# Patient Record
Sex: Male | Born: 1960 | Race: White | Hispanic: No | Marital: Single | Smoking: Never smoker
Health system: Southern US, Community
[De-identification: ages and names within clinical notes are randomized; demographics above are authoritative.]

## PROBLEM LIST (undated history)

## (undated) DIAGNOSIS — M1990 Unspecified osteoarthritis, unspecified site: Secondary | ICD-10-CM

## (undated) HISTORY — PX: APPENDECTOMY: SHX54

## (undated) HISTORY — PX: JOINT REPLACEMENT: SHX530

## (undated) HISTORY — PX: KNEE ARTHROSCOPY: SHX127

## (undated) HISTORY — PX: LIPOMA EXCISION: SHX5283

---

## 2008-03-27 ENCOUNTER — Ambulatory Visit: Payer: Self-pay | Admitting: Otolaryngology

## 2008-04-04 ENCOUNTER — Ambulatory Visit: Payer: Self-pay | Admitting: Otolaryngology

## 2012-09-18 ENCOUNTER — Ambulatory Visit: Payer: Self-pay | Admitting: Orthopedic Surgery

## 2017-07-10 DIAGNOSIS — M1712 Unilateral primary osteoarthritis, left knee: Secondary | ICD-10-CM | POA: Insufficient documentation

## 2017-07-20 ENCOUNTER — Encounter
Admission: RE | Admit: 2017-07-20 | Discharge: 2017-07-20 | Disposition: A | Discharge status: Home / Self Care | Payer: BLUE CROSS/BLUE SHIELD | Source: Ambulatory Visit | Attending: Orthopedic Surgery | Admitting: Orthopedic Surgery

## 2017-07-20 ENCOUNTER — Other Ambulatory Visit: Payer: Self-pay

## 2017-07-20 DIAGNOSIS — Z01818 Encounter for other preprocedural examination: Secondary | ICD-10-CM | POA: Diagnosis present

## 2017-07-20 DIAGNOSIS — M1712 Unilateral primary osteoarthritis, left knee: Secondary | ICD-10-CM | POA: Diagnosis not present

## 2017-07-20 HISTORY — DX: Unspecified osteoarthritis, unspecified site: M19.90

## 2017-07-20 LAB — CBC
HCT: 44.2 % (ref 40.0–52.0)
Hemoglobin: 14.7 g/dL (ref 13.0–18.0)
MCH: 31.9 pg (ref 26.0–34.0)
MCHC: 33.3 g/dL (ref 32.0–36.0)
MCV: 95.5 fL (ref 80.0–100.0)
PLATELETS: 253 10*3/uL (ref 150–440)
RBC: 4.62 MIL/uL (ref 4.40–5.90)
RDW: 13.8 % (ref 11.5–14.5)
WBC: 6 10*3/uL (ref 3.8–10.6)

## 2017-07-20 LAB — TYPE AND SCREEN
ABO/RH(D): A POS
Antibody Screen: NEGATIVE

## 2017-07-20 LAB — COMPREHENSIVE METABOLIC PANEL
ALK PHOS: 48 U/L (ref 38–126)
ALT: 19 U/L (ref 17–63)
ANION GAP: 7 (ref 5–15)
AST: 21 U/L (ref 15–41)
Albumin: 4 g/dL (ref 3.5–5.0)
BUN: 18 mg/dL (ref 6–20)
CHLORIDE: 106 mmol/L (ref 101–111)
CO2: 29 mmol/L (ref 22–32)
Calcium: 9.2 mg/dL (ref 8.9–10.3)
Creatinine, Ser: 0.88 mg/dL (ref 0.61–1.24)
Glucose, Bld: 90 mg/dL (ref 65–99)
Potassium: 3.7 mmol/L (ref 3.5–5.1)
SODIUM: 142 mmol/L (ref 135–145)
Total Bilirubin: 0.8 mg/dL (ref 0.3–1.2)
Total Protein: 7.2 g/dL (ref 6.5–8.1)

## 2017-07-20 LAB — PROTIME-INR
INR: 0.99
PROTHROMBIN TIME: 13 s (ref 11.4–15.2)

## 2017-07-20 LAB — URINALYSIS, ROUTINE W REFLEX MICROSCOPIC
Bilirubin Urine: NEGATIVE
GLUCOSE, UA: NEGATIVE mg/dL
HGB URINE DIPSTICK: NEGATIVE
Ketones, ur: NEGATIVE mg/dL
LEUKOCYTES UA: NEGATIVE
Nitrite: NEGATIVE
PH: 5 (ref 5.0–8.0)
PROTEIN: NEGATIVE mg/dL
SPECIFIC GRAVITY, URINE: 1.016 (ref 1.005–1.030)

## 2017-07-20 LAB — SURGICAL PCR SCREEN
MRSA, PCR: NEGATIVE
STAPHYLOCOCCUS AUREUS: NEGATIVE

## 2017-07-20 LAB — APTT: aPTT: 28 seconds (ref 24–36)

## 2017-07-20 LAB — C-REACTIVE PROTEIN: CRP: 1.2 mg/dL — AB (ref <1.0)

## 2017-07-20 LAB — SEDIMENTATION RATE: Sed Rate: 6 mm/hr (ref 0–20)

## 2017-07-20 NOTE — Patient Instructions (Signed)
Your procedure is scheduled on: 08/05/17 Fri Report to Same Day Surgery 2nd floor medical mall [facility](Medical Mall Entrance-take elevator on left to 2nd floor.  Check in with surgery information desk.) To find out your arrival time please call [phone] between 1PM - 3PM on 08/04/17 Thurs  Remember: Instructions that are not followed completely may result in serious medical risk, up to and including death, or upon the discretion of your surgeon and anesthesiologist your surgery may need to be rescheduled.    _x___ 1. Do not eat food after midnight the night before your procedure. You may drink clear liquids up to 2 hours before you are scheduled to arrive at the hospital for your procedure.  Do not drink clear liquids within 2 hours of your scheduled arrival to the hospital.  Clear liquids include  --Water or Apple juice without pulp  --Clear carbohydrate beverage such as ClearFast or Gatorade  --Black Coffee or Clear Tea (No milk, no creamers, do not add anything to                  the coffee or Tea Type 1 and type 2 diabetics should only drink water.  No gum chewing or hard candies.     __x__ 2. No Alcohol for 24 hours before or after surgery.   __x__3. No Smoking or e-cigarettes for 24 prior to surgery.  Do not use any chewable tobacco products for at least 6 hour prior to surgery   ____  4. Bring all medications with you on the day of surgery if instructed.    __x__ 5. Notify your doctor if there is any change in your medical condition     (cold, fever, infections).    x___6. On the morning of surgery brush your teeth with toothpaste and water.  You may rinse your mouth with mouth wash if you wish.  Do not swallow any toothpaste or mouthwash.   Do not wear jewelry, make-up, hairpins, clips or nail polish.  Do not wear lotions, powders, or perfumes. You may wear deodorant.  Do not shave 48 hours prior to surgery. Men may shave face and neck.  Do not bring valuables to the hospital.     [facility] is not responsible for any belongings or valuables.               Contacts, dentures or bridgework may not be worn into surgery.  Leave your suitcase in the car. After surgery it may be brought to your room.  For patients admitted to the hospital, discharge time is determined by your                       treatment team.  _  Patients discharged the day of surgery will not be allowed to drive home.  You will need someone to drive you home and stay with you the night of your procedure.    Please read over the following fact sheets that you were given:   [facility] Preparing for Surgery and or MRSA Information   _x___ Take anti-hypertensive listed below, cardiac, seizure, asthma,     anti-reflux and psychiatric medicines. These include:  1. None  2.  3.  4.  5.  6.  ____Fleets enema or Magnesium Citrate as directed.   _x___ Use CHG Soap or sage wipes as directed on instruction sheet   ____ Use inhalers on the day of surgery and bring to hospital day of surgery  ____ Stop Metformin and Janumet 2 days prior to surgery.    ____ Take 1/2 of usual insulin dose the night before surgery and none on the morning     surgery.   _x___ Follow recommendations from Cardiologist, Pulmonologist or PCP regarding          stopping Aspirin, Coumadin, Plavix ,Eliquis, Effient, or Pradaxa, and Pletal.  X____Stop Anti-inflammatories such as Advil, Aleve, Ibuprofen, Motrin, Naproxen, Naprosyn, Goodies powders or aspirin products. OK to take Tylenol and   Celebrex.  Stop Meloxicam 1 week before surgery.   _x___ Stop supplements until after surgery.  But may continue Vitamin D, Vitamin B,       and multivitamin.   ____ Bring C-Pap to the hospital.

## 2017-07-22 LAB — URINE CULTURE
CULTURE: NO GROWTH
Special Requests: NORMAL

## 2017-07-22 NOTE — Pre-Procedure Instructions (Signed)
CRP results sent to Dr. Hooten for review. 

## 2017-08-04 MED ORDER — CEFAZOLIN SODIUM-DEXTROSE 2-4 GM/100ML-% IV SOLN: 2.0000 g | INTRAVENOUS | Status: DC

## 2017-08-04 MED ORDER — TRANEXAMIC ACID 1000 MG/10ML IV SOLN
1000.0000 mg | INTRAVENOUS | Status: DC
  Filled 2017-08-04: qty 10

## 2017-08-05 ENCOUNTER — Other Ambulatory Visit: Payer: Self-pay

## 2017-08-05 ENCOUNTER — Inpatient Hospital Stay
Admission: RE | Admit: 2017-08-05 | Discharge: 2017-08-07 | DRG: 470 | Disposition: A | Discharge status: Home Health | Payer: BLUE CROSS/BLUE SHIELD | Source: Ambulatory Visit | Attending: Orthopedic Surgery | Admitting: Orthopedic Surgery

## 2017-08-05 ENCOUNTER — Inpatient Hospital Stay: Payer: BLUE CROSS/BLUE SHIELD

## 2017-08-05 ENCOUNTER — Ambulatory Visit: Payer: BLUE CROSS/BLUE SHIELD | Admitting: Anesthesiology

## 2017-08-05 ENCOUNTER — Encounter
Admission: RE | Disposition: A | Discharge status: Home Health | Payer: Self-pay | Source: Ambulatory Visit | Attending: Orthopedic Surgery

## 2017-08-05 ENCOUNTER — Encounter: Payer: Self-pay | Admitting: Orthopedic Surgery

## 2017-08-05 DIAGNOSIS — Z96659 Presence of unspecified artificial knee joint: Secondary | ICD-10-CM

## 2017-08-05 DIAGNOSIS — M1712 Unilateral primary osteoarthritis, left knee: Principal | ICD-10-CM | POA: Diagnosis present

## 2017-08-05 HISTORY — PX: KNEE ARTHROPLASTY: SHX992

## 2017-08-05 LAB — TYPE AND SCREEN
ABO/RH(D): A POS
Antibody Screen: NEGATIVE

## 2017-08-05 SURGERY — ARTHROPLASTY, KNEE, TOTAL, USING IMAGELESS COMPUTER-ASSISTED NAVIGATION
Anesthesia: Spinal | Site: Knee | Laterality: Left | Wound class: Clean

## 2017-08-05 MED ORDER — CEFAZOLIN SODIUM-DEXTROSE 2-4 GM/100ML-% IV SOLN
2.0000 g | Freq: Four times a day (QID) | INTRAVENOUS | Status: AC
  Administered 2017-08-05 – 2017-08-06 (×4): 2 g via INTRAVENOUS
  Filled 2017-08-05 (×4): qty 100

## 2017-08-05 MED ORDER — PROPOFOL 500 MG/50ML IV EMUL
INTRAVENOUS | Status: AC
  Filled 2017-08-05: qty 100

## 2017-08-05 MED ORDER — BUPIVACAINE LIPOSOME 1.3 % IJ SUSP
INTRAMUSCULAR | Status: AC
  Filled 2017-08-05: qty 20

## 2017-08-05 MED ORDER — SODIUM CHLORIDE 0.9 % IV SOLN
INTRAVENOUS | Status: DC
  Administered 2017-08-05: 16:00:00 via INTRAVENOUS
  Administered 2017-08-06: 100 mL/h via INTRAVENOUS

## 2017-08-05 MED ORDER — LACTATED RINGERS IV SOLN
INTRAVENOUS | Status: DC
  Administered 2017-08-05: 11:00:00 via INTRAVENOUS

## 2017-08-05 MED ORDER — BISACODYL 10 MG RE SUPP: 10.0000 mg | Freq: Every day | RECTAL | Status: DC | PRN

## 2017-08-05 MED ORDER — PHENOL 1.4 % MT LIQD
1.0000 | OROMUCOSAL | Status: DC | PRN
  Filled 2017-08-05: qty 177

## 2017-08-05 MED ORDER — TRANEXAMIC ACID 1000 MG/10ML IV SOLN
1000.0000 mg | Freq: Once | INTRAVENOUS | Status: AC
  Administered 2017-08-05: 1000 mg via INTRAVENOUS
  Filled 2017-08-05: qty 10

## 2017-08-05 MED ORDER — MENTHOL 3 MG MT LOZG
1.0000 | LOZENGE | OROMUCOSAL | Status: DC | PRN
  Filled 2017-08-05: qty 9

## 2017-08-05 MED ORDER — ONDANSETRON HCL 4 MG/2ML IJ SOLN: 4.0000 mg | Freq: Four times a day (QID) | INTRAMUSCULAR | Status: DC | PRN

## 2017-08-05 MED ORDER — FAMOTIDINE 20 MG PO TABS
20.0000 mg | ORAL_TABLET | Freq: Once | ORAL | Status: AC
  Administered 2017-08-05: 20 mg via ORAL

## 2017-08-05 MED ORDER — TETRACAINE HCL 1 % IJ SOLN
INTRAMUSCULAR | Status: DC | PRN
  Administered 2017-08-05: 2 mg via INTRASPINAL

## 2017-08-05 MED ORDER — BUPIVACAINE HCL (PF) 0.5 % IJ SOLN
INTRAMUSCULAR | Status: DC | PRN
  Administered 2017-08-05: 3 mL

## 2017-08-05 MED ORDER — ACETAMINOPHEN 10 MG/ML IV SOLN
1000.0000 mg | Freq: Four times a day (QID) | INTRAVENOUS | Status: AC
  Administered 2017-08-05 – 2017-08-06 (×4): 1000 mg via INTRAVENOUS
  Filled 2017-08-05 (×4): qty 100

## 2017-08-05 MED ORDER — MIDAZOLAM HCL 2 MG/2ML IJ SOLN
INTRAMUSCULAR | Status: AC
  Filled 2017-08-05: qty 2

## 2017-08-05 MED ORDER — ADULT MULTIVITAMIN W/MINERALS CH: 1.0000 | ORAL_TABLET | Freq: Every day | ORAL | Status: DC | PRN

## 2017-08-05 MED ORDER — DIPHENHYDRAMINE HCL 12.5 MG/5ML PO ELIX
12.5000 mg | ORAL_SOLUTION | ORAL | Status: DC | PRN
  Administered 2017-08-05: 25 mg via ORAL
  Filled 2017-08-05: qty 10

## 2017-08-05 MED ORDER — EPINEPHRINE PF 1 MG/ML IJ SOLN
INTRAMUSCULAR | Status: AC
  Filled 2017-08-05: qty 1

## 2017-08-05 MED ORDER — MIDAZOLAM HCL 5 MG/5ML IJ SOLN
INTRAMUSCULAR | Status: DC | PRN
  Administered 2017-08-05: 2 mg via INTRAVENOUS

## 2017-08-05 MED ORDER — HYDROMORPHONE HCL 1 MG/ML IJ SOLN: 0.5000 mg | INTRAMUSCULAR | Status: DC | PRN

## 2017-08-05 MED ORDER — DEXAMETHASONE SODIUM PHOSPHATE 10 MG/ML IJ SOLN
8.0000 mg | Freq: Once | INTRAMUSCULAR | Status: AC
  Administered 2017-08-05: 8 mg via INTRAVENOUS

## 2017-08-05 MED ORDER — FLEET ENEMA 7-19 GM/118ML RE ENEM: 1.0000 | ENEMA | Freq: Once | RECTAL | Status: DC | PRN

## 2017-08-05 MED ORDER — PROPOFOL 500 MG/50ML IV EMUL
INTRAVENOUS | Status: DC | PRN
  Administered 2017-08-05: 50 ug/kg/min via INTRAVENOUS

## 2017-08-05 MED ORDER — BUPIVACAINE HCL (PF) 0.25 % IJ SOLN
INTRAMUSCULAR | Status: AC
  Filled 2017-08-05: qty 60

## 2017-08-05 MED ORDER — GABAPENTIN 300 MG PO CAPS
300.0000 mg | ORAL_CAPSULE | Freq: Every day | ORAL | Status: DC
  Administered 2017-08-05 – 2017-08-06 (×2): 300 mg via ORAL
  Filled 2017-08-05 (×2): qty 1

## 2017-08-05 MED ORDER — CELECOXIB 200 MG PO CAPS
400.0000 mg | ORAL_CAPSULE | Freq: Once | ORAL | Status: AC
  Administered 2017-08-05: 400 mg via ORAL

## 2017-08-05 MED ORDER — TRAMADOL HCL 50 MG PO TABS
50.0000 mg | ORAL_TABLET | ORAL | Status: DC | PRN
  Administered 2017-08-05 – 2017-08-06 (×3): 100 mg via ORAL
  Filled 2017-08-05 (×3): qty 2

## 2017-08-05 MED ORDER — NEOMYCIN-POLYMYXIN B GU 40-200000 IR SOLN
Status: AC
Start: 2017-08-05 — End: 2017-08-05
  Filled 2017-08-05: qty 20

## 2017-08-05 MED ORDER — BUPIVACAINE HCL (PF) 0.25 % IJ SOLN
INTRAMUSCULAR | Status: DC | PRN
  Administered 2017-08-05: 60 mL

## 2017-08-05 MED ORDER — BUPIVACAINE HCL (PF) 0.5 % IJ SOLN
INTRAMUSCULAR | Status: AC
  Filled 2017-08-05: qty 10

## 2017-08-05 MED ORDER — SODIUM CHLORIDE 0.9 % IJ SOLN
INTRAMUSCULAR | Status: AC
  Filled 2017-08-05: qty 50

## 2017-08-05 MED ORDER — FERROUS SULFATE 325 (65 FE) MG PO TABS
325.0000 mg | ORAL_TABLET | Freq: Two times a day (BID) | ORAL | Status: DC
  Administered 2017-08-06 – 2017-08-07 (×3): 325 mg via ORAL
  Filled 2017-08-05 (×3): qty 1

## 2017-08-05 MED ORDER — FENTANYL CITRATE (PF) 100 MCG/2ML IJ SOLN: 25.0000 ug | INTRAMUSCULAR | Status: DC | PRN

## 2017-08-05 MED ORDER — DEXAMETHASONE SODIUM PHOSPHATE 10 MG/ML IJ SOLN
INTRAMUSCULAR | Status: AC
  Administered 2017-08-05: 8 mg via INTRAVENOUS
  Filled 2017-08-05: qty 1

## 2017-08-05 MED ORDER — CHLORHEXIDINE GLUCONATE 4 % EX LIQD: 60.0000 mL | Freq: Once | CUTANEOUS | Status: DC

## 2017-08-05 MED ORDER — SODIUM CHLORIDE 0.9 % IV SOLN
INTRAVENOUS | Status: DC | PRN
  Administered 2017-08-05: 60 mL

## 2017-08-05 MED ORDER — ONDANSETRON HCL 4 MG/2ML IJ SOLN
INTRAMUSCULAR | Status: DC | PRN
  Administered 2017-08-05: 4 mg via INTRAVENOUS

## 2017-08-05 MED ORDER — ALUM & MAG HYDROXIDE-SIMETH 200-200-20 MG/5ML PO SUSP: 30.0000 mL | ORAL | Status: DC | PRN

## 2017-08-05 MED ORDER — CEFAZOLIN SODIUM-DEXTROSE 2-4 GM/100ML-% IV SOLN
INTRAVENOUS | Status: AC
  Filled 2017-08-05: qty 100

## 2017-08-05 MED ORDER — GABAPENTIN 300 MG PO CAPS
ORAL_CAPSULE | ORAL | Status: AC
  Administered 2017-08-05: 300 mg via ORAL
  Filled 2017-08-05: qty 1

## 2017-08-05 MED ORDER — DEXAMETHASONE SODIUM PHOSPHATE 10 MG/ML IJ SOLN
INTRAMUSCULAR | Status: AC
  Filled 2017-08-05: qty 1

## 2017-08-05 MED ORDER — ONDANSETRON HCL 4 MG/2ML IJ SOLN
INTRAMUSCULAR | Status: AC
  Filled 2017-08-05: qty 2

## 2017-08-05 MED ORDER — CELECOXIB 200 MG PO CAPS
ORAL_CAPSULE | ORAL | Status: AC
  Administered 2017-08-05: 400 mg via ORAL
  Filled 2017-08-05: qty 2

## 2017-08-05 MED ORDER — ENOXAPARIN SODIUM 30 MG/0.3ML ~~LOC~~ SOLN
30.0000 mg | Freq: Two times a day (BID) | SUBCUTANEOUS | Status: DC
  Administered 2017-08-06 – 2017-08-07 (×3): 30 mg via SUBCUTANEOUS
  Filled 2017-08-05 (×3): qty 0.3

## 2017-08-05 MED ORDER — ACETAMINOPHEN 10 MG/ML IV SOLN
INTRAVENOUS | Status: AC
  Filled 2017-08-05: qty 100

## 2017-08-05 MED ORDER — ACETAMINOPHEN 10 MG/ML IV SOLN
INTRAVENOUS | Status: DC | PRN
  Administered 2017-08-05: 1000 mg via INTRAVENOUS

## 2017-08-05 MED ORDER — DEXAMETHASONE SODIUM PHOSPHATE 10 MG/ML IJ SOLN
INTRAMUSCULAR | Status: DC | PRN
  Administered 2017-08-05: 10 mg via INTRAVENOUS

## 2017-08-05 MED ORDER — VITAMIN C 500 MG PO TABS
500.0000 mg | ORAL_TABLET | Freq: Every day | ORAL | Status: DC | PRN
  Filled 2017-08-05: qty 1

## 2017-08-05 MED ORDER — CEFAZOLIN SODIUM-DEXTROSE 2-3 GM-%(50ML) IV SOLR
INTRAVENOUS | Status: DC | PRN
  Administered 2017-08-05: 2 g via INTRAVENOUS

## 2017-08-05 MED ORDER — PROPOFOL 10 MG/ML IV BOLUS
INTRAVENOUS | Status: DC | PRN
  Administered 2017-08-05 (×7): 24 mg via INTRAVENOUS
  Administered 2017-08-05: 50 mg via INTRAVENOUS

## 2017-08-05 MED ORDER — TRANEXAMIC ACID 1000 MG/10ML IV SOLN
INTRAVENOUS | Status: DC | PRN
  Administered 2017-08-05: 1000 mg via INTRAVENOUS

## 2017-08-05 MED ORDER — PROPOFOL 500 MG/50ML IV EMUL
INTRAVENOUS | Status: AC
  Filled 2017-08-05: qty 50

## 2017-08-05 MED ORDER — SENNOSIDES-DOCUSATE SODIUM 8.6-50 MG PO TABS
1.0000 | ORAL_TABLET | Freq: Two times a day (BID) | ORAL | Status: DC
  Administered 2017-08-05 – 2017-08-06 (×3): 1 via ORAL
  Filled 2017-08-05 (×3): qty 1

## 2017-08-05 MED ORDER — ONDANSETRON HCL 4 MG/2ML IJ SOLN: 4.0000 mg | Freq: Once | INTRAMUSCULAR | Status: DC | PRN

## 2017-08-05 MED ORDER — METOCLOPRAMIDE HCL 10 MG PO TABS
10.0000 mg | ORAL_TABLET | Freq: Three times a day (TID) | ORAL | Status: DC
  Administered 2017-08-05 – 2017-08-07 (×6): 10 mg via ORAL
  Filled 2017-08-05 (×6): qty 1

## 2017-08-05 MED ORDER — METOCLOPRAMIDE HCL 10 MG PO TABS: 5.0000 mg | ORAL_TABLET | Freq: Three times a day (TID) | ORAL | Status: DC | PRN

## 2017-08-05 MED ORDER — LIDOCAINE HCL (CARDIAC) 20 MG/ML IV SOLN
INTRAVENOUS | Status: DC | PRN
  Administered 2017-08-05: 60 mg via INTRAVENOUS

## 2017-08-05 MED ORDER — ACETAMINOPHEN 325 MG PO TABS: 325.0000 mg | ORAL_TABLET | Freq: Four times a day (QID) | ORAL | Status: DC | PRN

## 2017-08-05 MED ORDER — GABAPENTIN 300 MG PO CAPS
300.0000 mg | ORAL_CAPSULE | Freq: Once | ORAL | Status: AC
  Administered 2017-08-05: 300 mg via ORAL

## 2017-08-05 MED ORDER — PANTOPRAZOLE SODIUM 40 MG PO TBEC
40.0000 mg | DELAYED_RELEASE_TABLET | Freq: Two times a day (BID) | ORAL | Status: DC
  Administered 2017-08-05 – 2017-08-07 (×4): 40 mg via ORAL
  Filled 2017-08-05 (×4): qty 1

## 2017-08-05 MED ORDER — FAMOTIDINE 20 MG PO TABS
ORAL_TABLET | ORAL | Status: AC
  Administered 2017-08-05: 20 mg via ORAL
  Filled 2017-08-05: qty 1

## 2017-08-05 MED ORDER — NEOMYCIN-POLYMYXIN B GU 40-200000 IR SOLN
Status: DC | PRN
  Administered 2017-08-05: 14 mL

## 2017-08-05 MED ORDER — MAGNESIUM HYDROXIDE 400 MG/5ML PO SUSP
30.0000 mL | Freq: Every day | ORAL | Status: DC | PRN
  Administered 2017-08-07: 30 mL via ORAL
  Filled 2017-08-05: qty 30

## 2017-08-05 MED ORDER — CELECOXIB 200 MG PO CAPS
200.0000 mg | ORAL_CAPSULE | Freq: Two times a day (BID) | ORAL | Status: DC
  Administered 2017-08-05 – 2017-08-07 (×4): 200 mg via ORAL
  Filled 2017-08-05 (×4): qty 1

## 2017-08-05 MED ORDER — OXYCODONE HCL 5 MG PO TABS
10.0000 mg | ORAL_TABLET | ORAL | Status: DC | PRN
  Administered 2017-08-05 – 2017-08-06 (×2): 10 mg via ORAL
  Filled 2017-08-05 (×2): qty 2

## 2017-08-05 MED ORDER — LIDOCAINE HCL (PF) 2 % IJ SOLN
INTRAMUSCULAR | Status: AC
  Filled 2017-08-05: qty 10

## 2017-08-05 MED ORDER — OXYCODONE HCL 5 MG PO TABS
5.0000 mg | ORAL_TABLET | ORAL | Status: DC | PRN
  Administered 2017-08-05 – 2017-08-07 (×3): 5 mg via ORAL
  Filled 2017-08-05 (×3): qty 1

## 2017-08-05 MED ORDER — ONDANSETRON HCL 4 MG PO TABS: 4.0000 mg | ORAL_TABLET | Freq: Four times a day (QID) | ORAL | Status: DC | PRN

## 2017-08-05 MED ORDER — METOCLOPRAMIDE HCL 5 MG/ML IJ SOLN: 5.0000 mg | Freq: Three times a day (TID) | INTRAMUSCULAR | Status: DC | PRN

## 2017-08-05 SURGICAL SUPPLY — 67 items
BATTERY INSTRU NAVIGATION (MISCELLANEOUS) ×12 IMPLANT
BLADE CLIPPER SURG (BLADE) ×3 IMPLANT
BLADE SAGITTAL 25.0X1.19X90 (BLADE) ×2 IMPLANT
BLADE SAGITTAL 25.0X1.19X90MM (BLADE) ×1
BLADE SAW 1/2 (BLADE) ×3 IMPLANT
BLADE SAW 70X12.5 (BLADE) IMPLANT
CANISTER SUCT 1200ML W/VALVE (MISCELLANEOUS) ×3 IMPLANT
CANISTER SUCT 3000ML PPV (MISCELLANEOUS) ×6 IMPLANT
CAPT KNEE TOTAL 3 ATTUNE ×3 IMPLANT
CEMENT HV SMART SET (Cement) ×6 IMPLANT
COOLER POLAR GLACIER W/PUMP (MISCELLANEOUS) ×3 IMPLANT
CUFF TOURN 24 STER (MISCELLANEOUS) IMPLANT
CUFF TOURN 30 STER DUAL PORT (MISCELLANEOUS) ×3 IMPLANT
DRAPE SHEET LG 3/4 BI-LAMINATE (DRAPES) ×3 IMPLANT
DRSG DERMACEA 8X12 NADH (GAUZE/BANDAGES/DRESSINGS) ×3 IMPLANT
DRSG OPSITE POSTOP 4X14 (GAUZE/BANDAGES/DRESSINGS) ×3 IMPLANT
DRSG TEGADERM 4X4.75 (GAUZE/BANDAGES/DRESSINGS) ×3 IMPLANT
DURAPREP 26ML APPLICATOR (WOUND CARE) ×6 IMPLANT
ELECT CAUTERY BLADE 6.4 (BLADE) ×3 IMPLANT
ELECT REM PT RETURN 9FT ADLT (ELECTROSURGICAL) ×3
ELECTRODE REM PT RTRN 9FT ADLT (ELECTROSURGICAL) ×1 IMPLANT
EVACUATOR 1/8 PVC DRAIN (DRAIN) ×3 IMPLANT
EX-PIN ORTHOLOCK NAV 4X150 (PIN) ×6 IMPLANT
GLOVE BIOGEL M STRL SZ7.5 (GLOVE) ×6 IMPLANT
GLOVE BIOGEL PI IND STRL 7.0 (GLOVE) ×6 IMPLANT
GLOVE BIOGEL PI IND STRL 9 (GLOVE) ×1 IMPLANT
GLOVE BIOGEL PI INDICATOR 7.0 (GLOVE) ×12
GLOVE BIOGEL PI INDICATOR 9 (GLOVE) ×2
GLOVE INDICATOR 8.0 STRL GRN (GLOVE) ×3 IMPLANT
GLOVE SURG SYN 9.0  PF PI (GLOVE) ×2
GLOVE SURG SYN 9.0 PF PI (GLOVE) ×1 IMPLANT
GOWN STRL REUS W/ TWL LRG LVL3 (GOWN DISPOSABLE) ×2 IMPLANT
GOWN STRL REUS W/TWL 2XL LVL3 (GOWN DISPOSABLE) ×3 IMPLANT
GOWN STRL REUS W/TWL LRG LVL3 (GOWN DISPOSABLE) ×4
HOLDER FOLEY CATH W/STRAP (MISCELLANEOUS) ×3 IMPLANT
HOOD PEEL AWAY FLYTE STAYCOOL (MISCELLANEOUS) ×6 IMPLANT
KIT TURNOVER KIT A (KITS) ×3 IMPLANT
KNIFE SCULPS 14X20 (INSTRUMENTS) ×3 IMPLANT
LABEL OR SOLS (LABEL) ×3 IMPLANT
NDL SAFETY ECLIPSE 18X1.5 (NEEDLE) ×1 IMPLANT
NEEDLE HYPO 18GX1.5 SHARP (NEEDLE) ×2
NEEDLE SPNL 20GX3.5 QUINCKE YW (NEEDLE) ×6 IMPLANT
NS IRRIG 500ML POUR BTL (IV SOLUTION) ×3 IMPLANT
PACK TOTAL KNEE (MISCELLANEOUS) ×3 IMPLANT
PAD WRAPON POLAR KNEE (MISCELLANEOUS) ×1 IMPLANT
PIN DRILL QUICK PACK ×3 IMPLANT
PIN FIXATION 1/8DIA X 3INL (PIN) ×3 IMPLANT
PULSAVAC PLUS IRRIG FAN TIP (DISPOSABLE) ×3
SOL .9 NS 3000ML IRR  AL (IV SOLUTION) ×2
SOL .9 NS 3000ML IRR UROMATIC (IV SOLUTION) ×1 IMPLANT
SOL PREP PVP 2OZ (MISCELLANEOUS) ×3
SOLUTION PREP PVP 2OZ (MISCELLANEOUS) ×1 IMPLANT
SPONGE DRAIN TRACH 4X4 STRL 2S (GAUZE/BANDAGES/DRESSINGS) ×3 IMPLANT
STAPLER SKIN PROX 35W (STAPLE) ×3 IMPLANT
STRAP TIBIA SHORT (MISCELLANEOUS) ×3 IMPLANT
SUCTION FRAZIER HANDLE 10FR (MISCELLANEOUS) ×2
SUCTION TUBE FRAZIER 10FR DISP (MISCELLANEOUS) ×1 IMPLANT
SUT VIC AB 0 CT1 36 (SUTURE) ×3 IMPLANT
SUT VIC AB 1 CT1 36 (SUTURE) ×6 IMPLANT
SUT VIC AB 2-0 CT2 27 (SUTURE) ×3 IMPLANT
SYR 20CC LL (SYRINGE) ×3 IMPLANT
SYR 30ML LL (SYRINGE) ×6 IMPLANT
TIP FAN IRRIG PULSAVAC PLUS (DISPOSABLE) ×1 IMPLANT
TOWEL OR 17X26 4PK STRL BLUE (TOWEL DISPOSABLE) ×3 IMPLANT
TOWER CARTRIDGE SMART MIX (DISPOSABLE) ×3 IMPLANT
TRAY FOLEY W/METER SILVER 16FR (SET/KITS/TRAYS/PACK) ×3 IMPLANT
WRAPON POLAR PAD KNEE (MISCELLANEOUS) ×3

## 2017-08-05 NOTE — Transfer of Care (Signed)
Immediate Anesthesia Transfer of Care Note  Patient: Edward Compton  Procedure(s) Performed: COMPUTER ASSISTED TOTAL KNEE ARTHROPLASTY (Left Knee)  Patient Location: PACU  Anesthesia Type:Spinal  Level of Consciousness: awake, alert , oriented and patient cooperative  Airway & Oxygen Therapy: Patient Spontanous Breathing  Post-op Assessment: Report given to RN and Post -op Vital signs reviewed and stable  Post vital signs: Reviewed and stable  Last Vitals:  Vitals Value Taken Time  BP 118/75 08/05/2017  2:58 PM  Temp 36.6 C 08/05/2017  2:58 PM  Pulse 82 08/05/2017  3:00 PM  Resp 14 08/05/2017  3:01 PM  SpO2 96 % 08/05/2017  3:00 PM  Vitals shown include unvalidated device data.  Last Pain:  Vitals:   08/05/17 0955  TempSrc: Tympanic  PainSc: 0-No pain         Complications: No apparent anesthesia complications

## 2017-08-05 NOTE — Anesthesia Preprocedure Evaluation (Addendum)
Anesthesia Evaluation  Patient identified by MRN, date of birth, ID band Patient awake    Reviewed: Allergy & Precautions, NPO status , Patient's Chart, lab work & pertinent test results  Airway Mallampati: II  TM Distance: >3 FB     Dental   Pulmonary neg pulmonary ROS,    Pulmonary exam normal        Cardiovascular negative cardio ROS Normal cardiovascular exam     Neuro/Psych negative neurological ROS  negative psych ROS   GI/Hepatic negative GI ROS, Neg liver ROS,   Endo/Other  negative endocrine ROS  Renal/GU negative Renal ROS  negative genitourinary   Musculoskeletal   Abdominal Normal abdominal exam  (+)   Peds negative pediatric ROS (+)  Hematology negative hematology ROS (+)   Anesthesia Other Findings   Reproductive/Obstetrics                             Anesthesia Physical Anesthesia Plan  ASA: II  Anesthesia Plan: Spinal   Post-op Pain Management:    Induction: Intravenous  PONV Risk Score and Plan:   Airway Management Planned: Nasal Cannula  Additional Equipment:   Intra-op Plan:   Post-operative Plan:   Informed Consent: I have reviewed the patients History and Physical, chart, labs and discussed the procedure including the risks, benefits and alternatives for the proposed anesthesia with the patient or authorized representative who has indicated his/her understanding and acceptance.   Dental advisory given  Plan Discussed with: CRNA  Anesthesia Plan Comments:         Anesthesia Quick Evaluation

## 2017-08-05 NOTE — NC FL2 (Signed)
[place] MEDICAID FL2 LEVEL OF CARE SCREENING TOOL     IDENTIFICATION  Patient Name: Edward Compton Birthdate: 08/25/1960 Sex: male Admission Date (Current Location): 08/05/2017  [place]   Facility and Address:  [facility], [address], [place], [place] [address]      Provider Number: [id number]  Attending Physician Name and Address:  Donato HeinzHooten, James P, MD  Relative Name and Phone Number:       Current Level of Care: Hospital Recommended Level of Care: Skilled Nursing Facility Prior Approval Number:    Date Approved/Denied:   PASRR Number: [id number])  Discharge Plan: SNF    Current Diagnoses: Patient Active Problem List   Diagnosis Date Noted  . S/P total knee arthroplasty 08/05/2017  . Primary osteoarthritis of left knee 07/10/2017    Orientation RESPIRATION BLADDER Height & Weight     Self, Time, Situation, Place  Normal Continent Weight: 221 lb 12.5 oz (100.6 kg) Height:  6\' 1"  (185.4 cm)  BEHAVIORAL SYMPTOMS/MOOD NEUROLOGICAL BOWEL NUTRITION STATUS      Continent Diet(Diet: Regular )  AMBULATORY STATUS COMMUNICATION OF NEEDS Skin   Extensive Assist Verbally Surgical wounds(Incision: Left Knee. )                       Personal Care Assistance Level of Assistance  Bathing, Feeding, Dressing Bathing Assistance: Limited assistance Feeding assistance: Independent Dressing Assistance: Limited assistance     Functional Limitations Info  Sight, Hearing, Speech Sight Info: Adequate Hearing Info: Adequate Speech Info: Adequate    SPECIAL CARE FACTORS FREQUENCY  PT (By licensed PT), OT (By licensed OT)     PT Frequency: (5) OT Frequency: (5)            Contractures      Additional Factors Info  Code Status, Allergies Code Status Info: (Full Code. ) Allergies Info: (No Known Allergies. )           Current Medications (08/05/2017):  This is the current hospital active  medication list Current Facility-Administered Medications  Medication Dose Route Frequency Provider Last Rate Last Dose  . 0.9 %  sodium chloride infusion   Intravenous Continuous Hooten, Illene LabradorJames P, MD 100 mL/hr at 08/05/17 1555    . acetaminophen (OFIRMEV) IV 1,000 mg  1,000 mg Intravenous Q6H Hooten, Illene LabradorJames P, MD      . Melene Muller[START ON 08/06/2017] acetaminophen (TYLENOL) tablet 325-650 mg  325-650 mg Oral Q6H PRN Hooten, Illene LabradorJames P, MD      . alum & mag hydroxide-simeth (MAALOX/MYLANTA) 200-200-20 MG/5ML suspension 30 mL  30 mL Oral Q4H PRN Hooten, Illene LabradorJames P, MD      . bisacodyl (DULCOLAX) suppository 10 mg  10 mg Rectal Daily PRN Hooten, Illene LabradorJames P, MD      . ceFAZolin (ANCEF) 2-4 GM/100ML-% IVPB           . ceFAZolin (ANCEF) IVPB 2g/100 mL premix  2 g Intravenous Q6H Hooten, Illene LabradorJames P, MD      . celecoxib (CELEBREX) capsule 200 mg  200 mg Oral BID Hooten, Illene LabradorJames P, MD      . diphenhydrAMINE (BENADRYL) 12.5 MG/5ML elixir 12.5-25 mg  12.5-25 mg Oral Q4H PRN Hooten, Illene LabradorJames P, MD      . Melene Muller[START ON 08/06/2017] enoxaparin (LOVENOX) injection 30 mg  30 mg Subcutaneous Q12H Hooten, Illene LabradorJames P, MD      . ferrous sulfate tablet 325 mg  325 mg Oral BID WC Francesco SorHooten, James  P, MD      . gabapentin (NEURONTIN) capsule 300 mg  300 mg Oral QHS Hooten, Illene Labrador, MD      . HYDROmorphone (DILAUDID) injection 0.5-1 mg  0.5-1 mg Intravenous Q4H PRN Hooten, Illene Labrador, MD      . magnesium hydroxide (MILK OF MAGNESIA) suspension 30 mL  30 mL Oral Daily PRN Hooten, Illene Labrador, MD      . menthol-cetylpyridinium (CEPACOL) lozenge 3 mg  1 lozenge Oral PRN Hooten, Illene Labrador, MD       Or  . phenol (CHLORASEPTIC) mouth spray 1 spray  1 spray Mouth/Throat PRN Hooten, Illene Labrador, MD      . metoCLOPramide (REGLAN) tablet 5-10 mg  5-10 mg Oral Q8H PRN Hooten, Illene Labrador, MD       Or  . metoCLOPramide (REGLAN) injection 5-10 mg  5-10 mg Intravenous Q8H PRN Hooten, Illene Labrador, MD      . metoCLOPramide (REGLAN) tablet 10 mg  10 mg Oral TID AC & HS Hooten, Illene Labrador, MD      .  multivitamin with minerals tablet 1 tablet  1 tablet Oral Daily PRN Hooten, Illene Labrador, MD      . ondansetron (ZOFRAN) tablet 4 mg  4 mg Oral Q6H PRN Hooten, Illene Labrador, MD       Or  . ondansetron (ZOFRAN) injection 4 mg  4 mg Intravenous Q6H PRN Hooten, Illene Labrador, MD      . oxyCODONE (Oxy IR/ROXICODONE) immediate release tablet 10 mg  10 mg Oral Q4H PRN Hooten, Illene Labrador, MD      . oxyCODONE (Oxy IR/ROXICODONE) immediate release tablet 5 mg  5 mg Oral Q4H PRN Hooten, Illene Labrador, MD      . pantoprazole (PROTONIX) EC tablet 40 mg  40 mg Oral BID Hooten, Illene Labrador, MD      . senna-docusate (Senokot-S) tablet 1 tablet  1 tablet Oral BID Hooten, Illene Labrador, MD      . sodium phosphate (FLEET) 7-19 GM/118ML enema 1 enema  1 enema Rectal Once PRN Hooten, Illene Labrador, MD      . traMADol (ULTRAM) tablet 50-100 mg  50-100 mg Oral Q4H PRN Hooten, Illene Labrador, MD      . vitamin C (ASCORBIC ACID) tablet 500 mg  500 mg Oral Daily PRN Hooten, Illene Labrador, MD         Discharge Medications: Please see discharge summary for a list of discharge medications.  Relevant Imaging Results:  Relevant Lab Results:   Additional Information (SSN: [id number])  Wally Shevchenko, Darleen Crocker, LCSW

## 2017-08-05 NOTE — Op Note (Signed)
OPERATIVE NOTE  DATE OF SURGERY:  08/05/2017  PATIENT NAME:  Adarius Tigges   DOB: Nov 18, 1960  MRN: [id number]  PRE-OPERATIVE DIAGNOSIS: Degenerative arthrosis of the left knee, primary  POST-OPERATIVE DIAGNOSIS:  Same  PROCEDURE:  Left total knee arthroplasty using computer-assisted navigation  SURGEON:  Jena Gauss. M.D.  ASSISTANT:  Van Clines, PA (present and scrubbed throughout the case, critical for assistance with exposure, retraction, instrumentation, and closure)  ANESTHESIA: spinal  ESTIMATED BLOOD LOSS: 75 mL  FLUIDS REPLACED: 800 mL of crystalloid  TOURNIQUET TIME: 96 minutes  DRAINS: 2 medium Hemovac drains  SOFT TISSUE RELEASES: Anterior cruciate ligament, posterior cruciate ligament, deep and superficial medial collateral ligament, patellofemoral ligament  IMPLANTS UTILIZED: DePuy Attune size 7 posterior stabilized femoral component (cemented), size 7 rotating platform tibial component (cemented), 41 mm medialized dome patella (cemented), and a 12 mm stabilized rotating platform polyethylene insert.  INDICATIONS FOR SURGERY: Farouk Vivero is a 57 y.o. year old male with a long history of progressive knee pain. X-rays demonstrated severe degenerative changes in tricompartmental fashion. The patient had not seen any significant improvement despite conservative nonsurgical intervention. After discussion of the risks and benefits of surgical intervention, the patient expressed understanding of the risks benefits and agree with plans for total knee arthroplasty.   The risks, benefits, and alternatives were discussed at length including but not limited to the risks of infection, bleeding, nerve injury, stiffness, blood clots, the need for revision surgery, cardiopulmonary complications, among others, and they were willing to proceed.  PROCEDURE IN DETAIL: The patient was brought into the operating room and, after adequate spinal anesthesia was achieved, a  tourniquet was placed on the patient's upper thigh. The patient's knee and leg were cleaned and prepped with alcohol and DuraPrep and draped in the usual sterile fashion. A "timeout" was performed as per usual protocol. The lower extremity was exsanguinated using an Esmarch, and the tourniquet was inflated to 300 mmHg. An anterior longitudinal incision was made followed by a standard mid vastus approach. The deep fibers of the medial collateral ligament were elevated in a subperiosteal fashion off of the medial flare of the tibia so as to maintain a continuous soft tissue sleeve. The patella was subluxed laterally and the patellofemoral ligament was incised. Inspection of the knee demonstrated severe degenerative changes with full-thickness loss of articular cartilage. Osteophytes were debrided using a rongeur. Anterior and posterior cruciate ligaments were excised. Two 4.0 mm Schanz pins were inserted in the femur and into the tibia for attachment of the array of trackers used for computer-assisted navigation. Hip center was identified using a circumduction technique. Distal landmarks were mapped using the computer. The distal femur and proximal tibia were mapped using the computer. The distal femoral cutting guide was positioned using computer-assisted navigation so as to achieve a 5 distal valgus cut. The femur was sized and it was felt that a size 7 femoral component was appropriate. A size 7 femoral cutting guide was positioned and the anterior cut was performed and verified using the computer. This was followed by completion of the posterior and chamfer cuts. Femoral cutting guide for the central box was then positioned in the center box cut was performed.  Attention was then directed to the proximal tibia. Medial and lateral menisci were excised. The extramedullary tibial cutting guide was positioned using computer-assisted navigation so as to achieve a 0 varus-valgus alignment and 3 posterior slope. The  cut was performed and verified using the computer. The proximal tibia  was sized and it was felt that a size 7 tibial tray was appropriate. Tibial and femoral trials were inserted followed by insertion of a 10 mm polyethylene insert. The knee was felt to be tight medially. A Cobb elevator was used to elevate the superficial fibers of the medial collateral ligament.  The 10 mm polyethylene insert was replaced by a 12 mm polyethylene insert.  This allowed for excellent mediolateral soft tissue balancing both in flexion and in full extension. Finally, the patella was cut and prepared so as to accommodate a 41 mm medialized dome patella. A patella trial was placed and the knee was placed through a range of motion with excellent patellar tracking appreciated. The femoral trial was removed after debridement of posterior osteophytes. The central post-hole for the tibial component was reamed followed by insertion of a keel punch. Tibial trials were then removed. Cut surfaces of bone were irrigated with copious amounts of normal saline with antibiotic solution using pulsatile lavage and then suctioned dry. Polymethylmethacrylate cement was prepared in the usual fashion using a vacuum mixer. Cement was applied to the cut surface of the proximal tibia as well as along the undersurface of a size 7 rotating platform tibial component. Tibial component was positioned and impacted into place. Excess cement was removed using Personal assistantreer elevators. Cement was then applied to the cut surfaces of the femur as well as along the posterior flanges of the size 7 femoral component. The femoral component was positioned and impacted into place. Excess cement was removed using Personal assistantreer elevators. A 12 mm polyethylene trial was inserted and the knee was brought into full extension with steady axial compression applied. Finally, cement was applied to the backside of a 41 mm medialized dome patella and the patellar component was positioned and patellar  clamp applied. Excess cement was removed using Personal assistantreer elevators. After adequate curing of the cement, the tourniquet was deflated after a total tourniquet time of 96 minutes. Hemostasis was achieved using electrocautery. The knee was irrigated with copious amounts of normal saline with antibiotic solution using pulsatile lavage and then suctioned dry. 20 mL of 1.3% Exparel and 60 mL of 0.25% Marcaine in 40 mL of normal saline was injected along the posterior capsule, medial and lateral gutters, and along the arthrotomy site. A 12 mm stabilized rotating platform polyethylene insert was inserted and the knee was placed through a range of motion with excellent mediolateral soft tissue balancing appreciated and excellent patellar tracking noted. 2 medium drains were placed in the wound bed and brought out through separate stab incisions. The medial parapatellar portion of the incision was reapproximated using interrupted sutures of #1 Vicryl. Subcutaneous tissue was approximated in layers using first #0 Vicryl followed #2-0 Vicryl. The skin was approximated with skin staples. A sterile dressing was applied.  The patient tolerated the procedure well and was transported to the recovery room in stable condition.    Mayan Kloepfer P. Angie FavaHooten, Jr., M.D.

## 2017-08-05 NOTE — Anesthesia Post-op Follow-up Note (Signed)
Anesthesia QCDR form completed.        

## 2017-08-05 NOTE — Anesthesia Procedure Notes (Signed)
Spinal  Patient location during procedure: OR Start time: 08/05/2017 11:19 AM End time: 08/05/2017 11:24 AM Staffing Anesthesiologist: Yves Dillarroll, Paul, MD Resident/CRNA: Dava NajjarFrazier, Jaquawn Saffran, CRNA Performed: resident/CRNA  Preanesthetic Checklist Completed: patient identified, site marked, surgical consent, pre-op evaluation, timeout performed, IV checked, risks and benefits discussed and monitors and equipment checked Spinal Block Patient position: sitting Prep: Betadine Patient monitoring: heart rate, continuous pulse ox and blood pressure Approach: midline Location: L4-5 Injection technique: single-shot Needle Needle type: Pencan  Needle gauge: 24 G Needle length: 10 cm Needle insertion depth: 7 cm Assessment Sensory level: T6

## 2017-08-05 NOTE — H&P (Signed)
The patient has been re-examined, and the chart reviewed, and there have been no interval changes to the documented history and physical.    The risks, benefits, and alternatives have been discussed at length. The patient expressed understanding of the risks benefits and agreed with plans for surgical intervention.  Alexyss Balzarini P. Zahra Peffley, Jr. M.D.    

## 2017-08-05 NOTE — Discharge Instructions (Signed)
°  Instructions after Total Knee Replacement ° ° [name] P. Airabella Barley, Jr., M.D.    ° Dept. of Orthopaedics & Sports Medicine ° [facility] ° [address] ° [place], [place]  [address] ° Phone: [phone]   Fax: [phone] ° °  °DIET: °• Drink plenty of non-alcoholic fluids. °• Resume your normal diet. Include foods high in fiber. ° °ACTIVITY:  °• You may use crutches or a walker with weight-bearing as tolerated, unless instructed otherwise. °• You may be weaned off of the walker or crutches by your Physical Therapist.  °• Do NOT place pillows under the knee. Anything placed under the knee could limit your ability to straighten the knee.   °• Continue doing gentle exercises. Exercising will reduce the pain and swelling, increase motion, and prevent muscle weakness.   °• Please continue to use the TED compression stockings for 6 weeks. You may remove the stockings at night, but should reapply them in the morning. °• Do not drive or operate any equipment until instructed. ° °WOUND CARE:  °• Continue to use the PolarCare or ice packs periodically to reduce pain and swelling. °• You may bathe or shower after the staples are removed at the first office visit following surgery. ° °MEDICATIONS: °• You may resume your regular medications. °• Please take the pain medication as prescribed on the medication. °• Do not take pain medication on an empty stomach. °• You have been given a prescription for a blood thinner (Lovenox or Coumadin). Please take the medication as instructed. (NOTE: After completing a 2 week course of Lovenox, take one Enteric-coated aspirin once a day. This along with elevation will help reduce the possibility of phlebitis in your operated leg.) °• Do not drive or drink alcoholic beverages when taking pain medications. ° °CALL THE OFFICE FOR: °• Temperature above 101 degrees °• Excessive bleeding or drainage on the dressing. °• Excessive swelling, coldness, or paleness of the toes. °• Persistent  nausea and vomiting. ° °FOLLOW-UP:  °• You should have an appointment to return to the office in 10-14 days after surgery. °• Arrangements have been made for continuation of Physical Therapy (either home therapy or outpatient therapy). °  °

## 2017-08-06 MED ORDER — ENOXAPARIN SODIUM 40 MG/0.4ML ~~LOC~~ SOLN: 40.0000 mg | SUBCUTANEOUS | 0 refills | Status: AC

## 2017-08-06 MED ORDER — OXYCODONE HCL 5 MG PO TABS: 5.0000 mg | ORAL_TABLET | ORAL | 0 refills | Status: AC | PRN

## 2017-08-06 NOTE — Evaluation (Signed)
Physical Therapy Evaluation Patient Details Name: Edward ErbRussell Compton MRN: [id number] DOB: 07/03/1960 Today's Date: 08/06/2017   History of Present Illness  Pt is a 57 y/o M admitted for L TKR 08/05/17. PMHx includes arthritis L knee  Clinical Impression  Pt demonstrated bed mobility (supervison), transfers and ambulation 40 ft (RW CGA). Pt L knee AROM extension -2 degrees from neutral (supine HOB elevated, L heel in bone foam); flexion (sitting edge of recliner) 92 degrees. Pt demonstrated good quad activation and control w/ LLE straight leg raise w/ <10 degrees of lag; therefore does not require L knee immobilizer. Pt reported L knee pain 1/10 (at rest), 5/10 (w/ movement); RN notified. Pt would benefit from skilled PT to progress strength, ROM, ambulation, and optimal return to prior level of function. PT recommends HHPT upon discharge from acute hospitalization.    Follow Up Recommendations Home health PT    Equipment Recommendations  Rolling walker with 5" wheels;3in1 (PT)    Recommendations for Other Services       Precautions / Restrictions Precautions Precautions: Knee;Fall Precaution Booklet Issued: Yes (comment) Precaution Comments: L knee TKR Required Braces or Orthoses: Knee Immobilizer - Left Knee Immobilizer - Left: Discontinue once straight leg raise with < 10 degree lag Restrictions Weight Bearing Restrictions: Yes LLE Weight Bearing: Weight bearing as tolerated Other Position/Activity Restrictions: Hemovac in place and secure end of session      Mobility  Bed Mobility Overal bed mobility: Needs Assistance Bed Mobility: Supine to Sit     Supine to sit: Supervision     General bed mobility comments: required extra time and effort  Transfers Overall transfer level: Needs assistance Equipment used: Rolling walker (2 wheeled) Transfers: Sit to/from Stand Sit to Stand: Min guard         General transfer comment: RW CGA assist for stability and safety; vc's  for hand placement to push off, bring right heel back, bring hands up to RW handles  Ambulation/Gait Ambulation/Gait assistance: Min guard Ambulation Distance (Feet): 40 Feet Assistive device: Rolling walker (2 wheeled) Gait Pattern/deviations: Step-to pattern;Decreased stance time - left Gait velocity: decreased   General Gait Details: RW CGA for stability and safety; vc's to not push RW out to far, lead w/ LLE, push through BUE to offweight LLE when bringing RLE forward  Stairs            Wheelchair Mobility    Modified Rankin (Stroke Patients Only)       Balance Overall balance assessment: Needs assistance Sitting-balance support: Feet supported;No upper extremity supported Sitting balance-Leahy Scale: Normal Sitting balance - Comments: normal sitting balance reaching outside of BOS for phone when sitting EOB     Standing balance-Leahy Scale: Poor Standing balance comment: required RW CGA                             Pertinent Vitals/Pain Pain Assessment: 0-10 Pain Score: 1  Pain Location: L knee Pain Descriptors / Indicators: Aching;Sore Pain Intervention(s): Limited activity within patient's tolerance;Monitored during session;Premedicated before session;Repositioned;Ice applied    Home Living Family/patient expects to be discharged to:: Private residence Living Arrangements: Alone Available Help at Discharge: Friend(s);Available PRN/intermittently Type of Home: House Home Access: Stairs to enter Entrance Stairs-Rails: None Entrance Stairs-Number of Steps: 2 Home Layout: One level Home Equipment: Walker - 2 wheels;Cane - single point      Prior Function Level of Independence: Independent  Comments: Pt reports PLOF independent with all ADLs, household and community distances; Pt reports that he drives and works full time     Higher education careers adviser   Dominant Hand: Right    Extremity/Trunk Assessment   Upper Extremity Assessment Upper  Extremity Assessment: RUE deficits/detail;LUE deficits/detail;Overall WFL for tasks assessed RUE Deficits / Details: Pt demonstrated AROM WNL shoulder flexion, elbow flexion/extension, supination/pronation; strength 5/5 MMT shoulder flexion, elbow flexion/extension LUE Deficits / Details: Pt demonstrated AROM WNL shoulder flexion, elbow flexion/extension, supination/pronation; strength 5/5 MMT shoulder flexion, elbow flexion/extension    Lower Extremity Assessment Lower Extremity Assessment: RLE deficits/detail;LLE deficits/detail;Generalized weakness RLE Deficits / Details: Pt demonstrated AROM WNL and strength at least 3/5 hip flexion, knee flexion/extension, dorsiflexion/plantarflexion LLE Deficits / Details: strength at least 2/5 when moving legs across bed and performing LLE bed exercises LLE: Unable to fully assess due to pain    Cervical / Trunk Assessment Cervical / Trunk Assessment: Normal  Communication   Communication: No difficulties  Cognition Arousal/Alertness: Awake/alert Behavior During Therapy: WFL for tasks assessed/performed Overall Cognitive Status: Within Functional Limits for tasks assessed                                 General Comments: Pt A&O x 4      General Comments  Pt agreeable to session    Exercises Total Joint Exercises Ankle Circles/Pumps: AROM;Strengthening;Both;10 reps;Supine(HOB elevated) Quad Sets: AROM;Strengthening;Left;10 reps;Supine(HOB elevated) Gluteal Sets: AROM;Left;10 reps;Supine;Strengthening(HOB elevated) Short Arc Quad: AROM;Left;10 reps;Supine;Strengthening(HOB elevated) Heel Slides: AROM;Strengthening;Left;5 reps;Supine(HOB elevated) Hip ABduction/ADduction: AROM;Strengthening;Left;10 reps;Supine(HOB elevated) Straight Leg Raises: AROM;Strengthening;Left;10 reps(HOB elevated) Goniometric ROM: L knee extension (supine HOB elevated, L heel in bone foam) -2 degrees from neutral; L knee flexion (sitting edge of  recliner) 92 degrees.    Assessment/Plan    PT Assessment Patient needs continued PT services  PT Problem List Decreased strength;Decreased range of motion;Decreased knowledge of use of DME;Decreased activity tolerance;Decreased safety awareness;Decreased balance;Decreased knowledge of precautions;Decreased mobility;Decreased coordination;Pain       PT Treatment Interventions DME instruction;Balance training;Gait training;Stair training;Functional mobility training;Therapeutic activities;Therapeutic exercise;Patient/family education    PT Goals (Current goals can be found in the Care Plan section)  Acute Rehab PT Goals Patient Stated Goal: to go home PT Goal Formulation: With patient Time For Goal Achievement: 08/20/17 Potential to Achieve Goals: Good    Frequency BID   Barriers to discharge        Co-evaluation               AM-PAC PT "6 Clicks" Daily Activity  Outcome Measure Difficulty turning over in bed (including adjusting bedclothes, sheets and blankets)?: A Little Difficulty moving from lying on back to sitting on the side of the bed? : A Little Difficulty sitting down on and standing up from a chair with arms (e.g., wheelchair, bedside commode, etc,.)?: Unable Help needed moving to and from a bed to chair (including a wheelchair)?: A Little Help needed walking in hospital room?: A Little Help needed climbing 3-5 steps with a railing? : A Lot 6 Click Score: 15    End of Session Equipment Utilized During Treatment: Gait belt Activity Tolerance: Patient limited by pain Patient left: in chair;with call bell/phone within reach;with chair alarm set;with SCD's reapplied(B heels elevated by towel rolls; polar care applied and activated; chair used to extend length of recliner due to Pt's long legs) Nurse Communication: Mobility status(L knee pain 5/10 w/ movement) PT Visit Diagnosis:  Unsteadiness on feet (R26.81);Difficulty in walking, not elsewhere classified  (R26.2);Other abnormalities of gait and mobility (R26.89);Muscle weakness (generalized) (M62.81);Pain Pain - Right/Left: Left Pain - part of body: Knee    Time: 1610-9604 PT Time Calculation (min) (ACUTE ONLY): 35 min   Charges:         PT G Codes:        Edward Compton, SPT 08/06/2017, 10:08 AM

## 2017-08-06 NOTE — Care Management (Signed)
Patient says that he has a walker

## 2017-08-06 NOTE — Progress Notes (Signed)
Physical Therapy Treatment Patient Details Name: Edward Compton MRN: [id number] DOB: 03-14-1961 Today's Date: 08/06/2017    History of Present Illness Pt is a 56 y/o M admitted for L TKR 08/05/17. PMHx includes arthritis L knee    PT Comments    Participated in exercises as described below.  Pt able to stand and walk around nursing unit x 1 with stop in bathroom to stand and void.  Pt progressing well with no buckling or LOB noted.  Will need stair training tomorrow but anticipate him doing well.  Remained in recliner at end of session.   Follow Up Recommendations  Home health PT     Equipment Recommendations  Rolling walker with 5" wheels;3in1 (PT)    Recommendations for Other Services       Precautions / Restrictions Precautions Precautions: Knee;Fall Precaution Comments: L knee TKR Required Braces or Orthoses: Knee Immobilizer - Left Knee Immobilizer - Left: Discontinue once straight leg raise with < 10 degree lag Other Brace/Splint: able to SLR x 10 so KI not used. Restrictions Weight Bearing Restrictions: Yes LLE Weight Bearing: Weight bearing as tolerated Other Position/Activity Restrictions: Hemovac in place and secure end of session    Mobility  Bed Mobility               General bed mobility comments: in recliner before and after  Transfers Overall transfer level: Needs assistance Equipment used: Rolling walker (2 wheeled) Transfers: Sit to/from Stand Sit to Stand: Min guard            Ambulation/Gait Ambulation/Gait assistance: Min guard Ambulation Distance (Feet): 200 Feet Assistive device: Rolling walker (2 wheeled) Gait Pattern/deviations: Step-through pattern;Decreased step length - right;Decreased step length - left   Gait velocity interpretation: Below normal speed for age/gender     Stairs            Wheelchair Mobility    Modified Rankin (Stroke Patients Only)       Balance Overall balance assessment: Needs  assistance Sitting-balance support: Feet supported;No upper extremity supported Sitting balance-Leahy Scale: Normal     Standing balance support: Bilateral upper extremity supported Standing balance-Leahy Scale: Fair Standing balance comment: required RW CGA                            Cognition Arousal/Alertness: Awake/alert Behavior During Therapy: WFL for tasks assessed/performed Overall Cognitive Status: Within Functional Limits for tasks assessed                                        Exercises Total Joint Exercises Ankle Circles/Pumps: AROM;Strengthening;Both;10 reps;Supine Quad Sets: AROM;Strengthening;Left;10 reps;Supine Gluteal Sets: AROM;Left;10 reps;Supine;Strengthening Short Arc Quad: AROM;Left;10 reps;Supine;Strengthening Heel Slides: AROM;Strengthening;Left;5 reps;Supine Hip ABduction/ADduction: AROM;Strengthening;Left;10 reps;Supine Straight Leg Raises: AROM;Strengthening;Left;10 reps    General Comments        Pertinent Vitals/Pain Pain Assessment: 0-10 Pain Score: 2  Pain Location: L knee Pain Descriptors / Indicators: Aching;Sore Pain Intervention(s): Limited activity within patient's tolerance;Ice applied    Home Living                      Prior Function            PT Goals (current goals can now be found in the care plan section) Progress towards PT goals: Progressing toward goals    Frequency    BID  PT Plan Current plan remains appropriate    Co-evaluation              AM-PAC PT "6 Clicks" Daily Activity  Outcome Measure  Difficulty turning over in bed (including adjusting bedclothes, sheets and blankets)?: A Little Difficulty moving from lying on back to sitting on the side of the bed? : A Little Difficulty sitting down on and standing up from a chair with arms (e.g., wheelchair, bedside commode, etc,.)?: A Little Help needed moving to and from a bed to chair (including a  wheelchair)?: A Little Help needed walking in hospital room?: A Little Help needed climbing 3-5 steps with a railing? : A Little 6 Click Score: 18    End of Session Equipment Utilized During Treatment: Gait belt Activity Tolerance: Patient tolerated treatment well Patient left: in chair;with chair alarm set;with call bell/phone within reach   Pain - Right/Left: Left Pain - part of body: Knee     Time: [id number] PT Time Calculation (min) (ACUTE ONLY): 20 min  Charges:  $Gait Training: 8-22 mins                    G Codes:       Danielle DessSarah Lorella Gomez, PTA 08/06/17, 2:41 PM

## 2017-08-06 NOTE — Progress Notes (Signed)
  Subjective: 1 Day Post-Op Procedure(s) (LRB): COMPUTER ASSISTED TOTAL KNEE ARTHROPLASTY (Left) Patient reports pain as mild.   Patient is well, and has had no acute complaints or problems PT and care management to assist with discharge planning. Negative for chest pain and shortness of breath Fever: no Gastrointestinal:Negative for nausea and vomiting  Objective: Vital signs in last 24 hours: Temp:  [97.4 F (36.3 C)-98.9 F (37.2 C)] 98.3 F (36.8 C) (04/06 0720) Pulse Rate:  [48-77] 65 (04/06 0720) Resp:  [16-26] 19 (04/06 0348) BP: (111-136)/(59-84) 120/71 (04/06 0720) SpO2:  [94 %-100 %] 97 % (04/06 0720) Weight:  [100.6 kg (221 lb 12.5 oz)] 100.6 kg (221 lb 12.5 oz) (04/05 1600)  Intake/Output from previous day:  Intake/Output Summary (Last 24 hours) at 08/06/2017 0907 Last data filed at 08/06/2017 0640 Gross per 24 hour  Intake 4168.33 ml  Output 4230 ml  Net -61.67 ml    Intake/Output this shift: No intake/output data recorded.  Labs: No results for input(s): HGB in the last 72 hours. No results for input(s): WBC, RBC, HCT, PLT in the last 72 hours. No results for input(s): NA, K, CL, CO2, BUN, CREATININE, GLUCOSE, CALCIUM in the last 72 hours. No results for input(s): LABPT, INR in the last 72 hours.   EXAM General - Patient is Alert, Appropriate and Oriented Extremity - Sensation intact distally Intact pulses distally Dorsiflexion/Plantar flexion intact Incision: dressing C/D/I No cellulitis present Dressing/Incision - clean, dry, no drainage, bulky dressing and woundvac intact this morning. Motor Function - intact, moving foot and toes well on exam.  Abdomen with mild distention, mild tympany but not normal bowel sounds. Able to perform a straight leg raise with mild assistance.  Past Medical History:  Diagnosis Date  . Arthritis    Assessment/Plan: 1 Day Post-Op Procedure(s) (LRB): COMPUTER ASSISTED TOTAL KNEE ARTHROPLASTY (Left) Active Problems:   S/P total knee arthroplasty  Estimated body mass index is 29.26 kg/m as calculated from the following:   Height as of this encounter: 6\' 1"  (1.854 m).   Weight as of this encounter: 100.6 kg (221 lb 12.5 oz). Advance diet Up with therapy D/C IV fluids when tolerating po intake.  Up with therapy today. Begin working on a BM. Will remove bulky dressing tomorrow in addition to the woundvac. Plan will be for discharge home tomorrow.  DVT Prophylaxis - Lovenox, Foot Pumps and TED hose Weight-Bearing as tolerated to left leg  J. Horris LatinoLance Arloa Prak, PA-C [facility] 08/06/2017, 9:07 AM

## 2017-08-06 NOTE — Care Management (Signed)
Patient with total knee arthroplasty 08/05/2017.  Physical therapy is recommending home with home health.  Notified [facility] who is following patient for home health needs.  Patient declines that he needs BSC or walker.  His copay for his lovenox will be 10 dollars.

## 2017-08-06 NOTE — Anesthesia Postprocedure Evaluation (Signed)
Anesthesia Post Note  Patient: Edward Compton  Procedure(s) Performed: COMPUTER ASSISTED TOTAL KNEE ARTHROPLASTY (Left Knee)  Patient location during evaluation: PACU Anesthesia Type: Spinal Level of consciousness: oriented and awake and alert Pain management: pain level controlled Vital Signs Assessment: post-procedure vital signs reviewed and stable Respiratory status: spontaneous breathing, respiratory function stable and nonlabored ventilation Cardiovascular status: blood pressure returned to baseline and stable Postop Assessment: no headache and no backache Anesthetic complications: no     Last Vitals:  Vitals:   08/06/17 0348 08/06/17 0720  BP: 118/72 120/71  Pulse: 70 65  Resp: 19   Temp: 37.2 C 36.8 C  SpO2: 96% 97%    Last Pain:  Vitals:   08/06/17 0810  TempSrc:   PainSc: 2                  Anis Degidio

## 2017-08-07 NOTE — Progress Notes (Signed)
Physical Therapy Treatment Patient Details Name: Edward Compton MRN: [id number] DOB: Oct 11, 1960 Today's Date: 08/07/2017    History of Present Illness Pt is a 57 y/o M admitted for L TKR 08/05/17. PMHx includes arthritis L knee    PT Comments    Pt modified independent with bed mobility, transfers, and ambulation with RW around nursing loop.  Pt able to navigate stairs with UE support CGA to SBA.  No L knee buckling or instability noted during session.  L knee ROM 0-100 degrees.  Overall steady with functional mobility using RW.  Pain 4/10 L knee during session but 2/10 end of session resting in chair.  Pt appears safe to discharge home when medically appropriate.   Follow Up Recommendations  Home health PT     Equipment Recommendations  Rolling walker with 5" wheels;3in1 (PT)    Recommendations for Other Services       Precautions / Restrictions Precautions Precautions: Knee;Fall Precaution Booklet Issued: Yes (comment) Precaution Comments: L knee TKR Required Braces or Orthoses: Knee Immobilizer - Left Knee Immobilizer - Left: Discontinue once straight leg raise with < 10 degree lag Restrictions Weight Bearing Restrictions: Yes LLE Weight Bearing: Weight bearing as tolerated    Mobility  Bed Mobility Overal bed mobility: Modified Independent Bed Mobility: Supine to Sit;Sit to Supine     Supine to sit: Modified independent (Device/Increase time) Sit to supine: Modified independent (Device/Increase time)   General bed mobility comments: supine to/from sit with bed flat with mild increased effort; no assist required  Transfers Overall transfer level: Needs assistance Equipment used: Rolling walker (2 wheeled) Transfers: Sit to/from Stand Sit to Stand: Modified independent (Device/Increase time)         General transfer comment: steady with transfers from recliner, mat table (x2 trials), and bed using RW  Ambulation/Gait Ambulation/Gait assistance: Modified  independent (Device/Increase time) Ambulation Distance (Feet): 240 Feet Assistive device: Rolling walker (2 wheeled)   Gait velocity: decreased   General Gait Details: partial step through gait pattern; vc's to increase L knee flexion during L LE swing phase; good L heel strike; steady with RW   Stairs Stairs: Yes   Stair Management: Step to pattern;Forwards Number of Stairs: (4 stairs x2) General stair comments: 4 steps with B railing and 4 steps using RW; initial vc's and visual demo required and then pt able to perform on own safely; steady; no knee buckling noted  Wheelchair Mobility    Modified Rankin (Stroke Patients Only)       Balance Overall balance assessment: Needs assistance Sitting-balance support: No upper extremity supported;Feet supported Sitting balance-Leahy Scale: Normal Sitting balance - Comments: steady sitting reaching outside BOS   Standing balance support: Single extremity supported Standing balance-Leahy Scale: Poor Standing balance comment: requires at least single UE support for standing reaching outside BOS                            Cognition Arousal/Alertness: Awake/alert Behavior During Therapy: WFL for tasks assessed/performed Overall Cognitive Status: Within Functional Limits for tasks assessed                                        Exercises Total Joint Exercises Goniometric ROM: L knee extension 0 degrees; L knee flexion 100 degrees sitting edge of recliner    General Comments General comments (skin integrity, edema, etc.): L  LE dressings, hemovac, and polar care in place Pt agreeable to PT session.     Pertinent Vitals/Pain Pain Assessment: 0-10 Pain Score: 2  Pain Location: L knee Pain Descriptors / Indicators: Discomfort Pain Intervention(s): Limited activity within patient's tolerance;Monitored during session;Premedicated before session;Other (comment);Repositioned(polar care applied)    Home  Living                      Prior Function            PT Goals (current goals can now be found in the care plan section) Acute Rehab PT Goals Patient Stated Goal: to go home PT Goal Formulation: With patient Time For Goal Achievement: 08/20/17 Potential to Achieve Goals: Good Progress towards PT goals: Progressing toward goals    Frequency    BID      PT Plan Current plan remains appropriate    Co-evaluation              AM-PAC PT "6 Clicks" Daily Activity  Outcome Measure  Difficulty turning over in bed (including adjusting bedclothes, sheets and blankets)?: None Difficulty moving from lying on back to sitting on the side of the bed? : A Little Difficulty sitting down on and standing up from a chair with arms (e.g., wheelchair, bedside commode, etc,.)?: A Little Help needed moving to and from a bed to chair (including a wheelchair)?: None Help needed walking in hospital room?: None Help needed climbing 3-5 steps with a railing? : A Little 6 Click Score: 21    End of Session Equipment Utilized During Treatment: Gait belt Activity Tolerance: Patient tolerated treatment well Patient left: in chair;with call bell/phone within reach;with chair alarm set;with SCD's reapplied(B heels elevated via towel rolls; polar care in place and activated) Nurse Communication: Mobility status;Precautions;Weight bearing status PT Visit Diagnosis: Unsteadiness on feet (R26.81);Difficulty in walking, not elsewhere classified (R26.2);Other abnormalities of gait and mobility (R26.89);Muscle weakness (generalized) (M62.81);Pain Pain - Right/Left: Left Pain - part of body: Knee     Time: [id number] PT Time Calculation (min) (ACUTE ONLY): 38 min  Charges:  $Gait Training: 8-22 mins $Therapeutic Exercise: 8-22 mins $Therapeutic Activity: 8-22 mins                    G CodesHendricks Limes:       Edward Compton, PT 08/07/17, 9:11 AM [phone]

## 2017-08-07 NOTE — Progress Notes (Signed)
[facility] DC & RX instructions given and patient acknowledged understanding.  IV removed, belongings packed. Friend here to transport patient home.

## 2017-08-07 NOTE — Progress Notes (Signed)
  Subjective: 2 Days Post-Op Procedure(s) (LRB): COMPUTER ASSISTED TOTAL KNEE ARTHROPLASTY (Left) Patient reports pain as mild.   Patient is well, and has had no acute complaints or problems Plan is for discharge home with HHPT. Negative for chest pain and shortness of breath Fever: no Gastrointestinal:Negative for nausea and vomiting  Objective: Vital signs in last 24 hours: Temp:  [97.8 F (36.6 C)-98.7 F (37.1 C)] 98.7 F (37.1 C) (04/07 0735) Pulse Rate:  [55-68] 56 (04/07 0735) Resp:  [16] 16 (04/07 0025) BP: (109-133)/(66-74) 133/74 (04/07 0735) SpO2:  [97 %-99 %] 99 % (04/07 0735)  Intake/Output from previous day:  Intake/Output Summary (Last 24 hours) at 08/07/2017 0944 Last data filed at 08/07/2017 0700 Gross per 24 hour  Intake 1780 ml  Output 3730 ml  Net -1950 ml    Intake/Output this shift: No intake/output data recorded.  Labs: No results for input(s): HGB in the last 72 hours. No results for input(s): WBC, RBC, HCT, PLT in the last 72 hours. No results for input(s): NA, K, CL, CO2, BUN, CREATININE, GLUCOSE, CALCIUM in the last 72 hours. No results for input(s): LABPT, INR in the last 72 hours.   EXAM General - Patient is Alert, Appropriate and Oriented Extremity - Sensation intact distally Intact pulses distally Dorsiflexion/Plantar flexion intact Incision: dressing C/D/I No cellulitis present Dressing/Incision - Bulky dressing removed today, hemovac removed. Motor Function - intact, moving foot and toes well on exam.  Abdomen soft with normal BS. Able to perform a straight leg raise with mild assistance.  Past Medical History:  Diagnosis Date  . Arthritis    Assessment/Plan: 2 Days Post-Op Procedure(s) (LRB): COMPUTER ASSISTED TOTAL KNEE ARTHROPLASTY (Left) Active Problems:   S/P total knee arthroplasty  Estimated body mass index is 29.26 kg/m as calculated from the following:   Height as of this encounter: 6\' 1"  (1.854 m).   Weight as of  this encounter: 100.6 kg (221 lb 12.5 oz). Advance diet Up with therapy D/C IV fluids when tolerating po intake.  Up with therapy today. Walked over 240 feet yesterday. Pt has had a BM. Bulky dressing and hemovac were removed today, sterile 4x4 with tegaderm applied. Discharge home today with HHPT.  DVT Prophylaxis - Lovenox, Foot Pumps and TED hose Weight-Bearing as tolerated to left leg  J. Horris LatinoLance McGhee, PA-C [facility] 08/07/2017, 9:44 AM

## 2017-08-07 NOTE — Discharge Summary (Signed)
Physician Discharge Summary  Patient ID: Edward Compton MRN: [id number] DOB/AGE: 56-Jan-1962 56 y.o.  Admit date: 08/05/2017 Discharge date: 08/07/2017  Admission Diagnoses:  PRIMARY OSTEOARTHRITIS OF LEFT KNEE  Discharge Diagnoses: Patient Active Problem List   Diagnosis Date Noted  . S/P total knee arthroplasty 08/05/2017  . Primary osteoarthritis of left knee 07/10/2017    Past Medical History:  Diagnosis Date  . Arthritis    Transfusion: None.   Consultants (if any):   Discharged Condition: Improved  Hospital Course: Edward Compton is an 57 y.o. male who was admitted 08/05/2017 with a diagnosis of primary osteoarthritis of the left knee and went to the operating room on 08/05/2017 and underwent the above named procedures.    Surgeries: Procedure(s): COMPUTER ASSISTED TOTAL KNEE ARTHROPLASTY on 08/05/2017 Patient tolerated the surgery well. Taken to PACU where she was stabilized and then transferred to the orthopedic floor.  Started on Lovenox 30mg  q 12 hrs. Foot pumps applied bilaterally at 80 mm. Heels elevated on bed with rolled towels. No evidence of DVT. Negative Homan. Physical therapy started on day #1 for gait training and transfer. OT started day #1 for ADL and assisted devices.  Patient's IV and foley were removed on POD1.  Hemovac drain removed on POD2.  Implants: DePuy Attune size 7 posterior stabilized femoral component (cemented), size 7 rotating platform tibial component (cemented), 41 mm medialized dome patella (cemented), and a 12 mm stabilized rotating platform polyethylene insert.  He was given perioperative antibiotics:  Anti-infectives (From admission, onward)   Start     Dose/Rate Route Frequency Ordered Stop   08/05/17 1730  ceFAZolin (ANCEF) IVPB 2g/100 mL premix     2 g 200 mL/hr over 30 Minutes Intravenous Every 6 hours 08/05/17 1627 08/06/17 1224   08/05/17 1003  ceFAZolin (ANCEF) 2-4 GM/100ML-% IVPB    Note to Pharmacy:  Lorrene Reid   :  cabinet override      08/05/17 1003 08/05/17 2214   08/05/17 0600  ceFAZolin (ANCEF) IVPB 2g/100 mL premix  Status:  Discontinued     2 g 200 mL/hr over 30 Minutes Intravenous On call to O.R. 08/04/17 2236 08/05/17 4098    .  He was given sequential compression devices, early ambulation, and Lovenox for DVT prophylaxis.  He benefited maximally from the hospital stay and there were no complications.    Recent vital signs:  Vitals:   08/07/17 0025 08/07/17 0735  BP: 111/69 133/74  Pulse: 61 (!) 56  Resp: 16   Temp: 98.1 F (36.7 C) 98.7 F (37.1 C)  SpO2: 97% 99%    Recent laboratory studies:  Lab Results  Component Value Date   HGB 14.7 07/20/2017   Lab Results  Component Value Date   WBC 6.0 07/20/2017   PLT 253 07/20/2017   Lab Results  Component Value Date   INR 0.99 07/20/2017   Lab Results  Component Value Date   NA 142 07/20/2017   K 3.7 07/20/2017   CL 106 07/20/2017   CO2 29 07/20/2017   BUN 18 07/20/2017   CREATININE 0.88 07/20/2017   GLUCOSE 90 07/20/2017    Discharge Medications:   Allergies as of 08/07/2017   No Known Allergies     Medication List    TAKE these medications   acetaminophen 500 MG tablet Commonly known as:  TYLENOL Take 500 mg by mouth every 6 (six) hours as needed (for pain.).   enoxaparin 40 MG/0.4ML injection Commonly known as:  LOVENOX Inject 0.4  mLs (40 mg total) into the skin daily.   meloxicam 7.5 MG tablet Commonly known as:  MOBIC Take 7.5 mg by mouth 2 (two) times daily as needed. For pain.   multivitamin with minerals Tabs tablet Take 1 tablet by mouth daily as needed (for fatigue). Men's Health Multivitamin   oxyCODONE 5 MG immediate release tablet Commonly known as:  Oxy IR/ROXICODONE Take 1-2 tablets (5-10 mg total) by mouth every 4 (four) hours as needed for moderate pain (pain score 4-6).   vitamin C 250 MG tablet Commonly known as:  ASCORBIC ACID Take 500 mg by mouth daily as needed (for immune  system health.).            Durable Medical Equipment  (From admission, onward)        Start     Ordered   08/05/17 1627  DME Walker rolling  Once    Question:  Patient needs a walker to treat with the following condition  Answer:  Total knee replacement status   08/05/17 1627   08/05/17 1627  DME Bedside commode  Once    Question:  Patient needs a bedside commode to treat with the following condition  Answer:  Total knee replacement status   08/05/17 1627      Diagnostic Studies: Dg Knee Left Port  Result Date: 08/05/2017 CLINICAL DATA:  Postop left knee replacement. EXAM: PORTABLE LEFT KNEE - 1-2 VIEW COMPARISON:  None. FINDINGS: Left knee prosthetic components appear well seated and well aligned. There is no acute fracture or evidence of an operative complication. IMPRESSION: Well-positioned left knee arthroplasty Electronically Signed   By: Amie Portlandavid  Ormond M.D.   On: 08/05/2017 15:44    Disposition: Discharge home today with HHPT.    Follow-up Information    Ronnette JuniperGaines, Thomas C, PA-C On 08/22/2017.   Specialties:  Orthopedic Surgery, Emergency Medicine Why:  at 9:45am Contact information: [address] [place] [address] [phone]        Donato HeinzHooten, Nichelle Renwick P, MD On 09/15/2017.   Specialty:  Orthopedic Surgery Why:  at 9:30am Contact information: [address] [facility] [place] [address] [phone]          Signed: Meriel PicaJames L Teiara Baria PA-C 08/07/2017, 9:45 AM

## 2017-08-07 NOTE — Care Management (Signed)
Patient for discharge home today.  Notified [facility].  Informed patient that his copay for lov[facility]ox [place] [facility] [place]

## 2017-08-08 ENCOUNTER — Encounter: Payer: Self-pay | Admitting: Orthopedic Surgery

## 2017-08-11 ENCOUNTER — Telehealth: Payer: Self-pay

## 2017-08-11 NOTE — Telephone Encounter (Signed)
EMMI Follow-up: Report noted that patient hadn't read discharge papers yet.  I called and talked with Mr. Edward Compton and he said he has now read over the paperwork.  [facility] started on Monday and had 2 sessiona so far. Wondered why staples were going to be kept in so long since f/u appt. Is 4/22.  Will talk with PT tomorrow to see if they recommend calling MD to get an earlier appointment to have the staples removed.  Recovery is challenging but taking medications and following through with PT. No other concerns at this time.

## 2017-08-19 ENCOUNTER — Telehealth: Payer: Self-pay | Admitting: Licensed Clinical Social Worker

## 2017-08-19 NOTE — Telephone Encounter (Signed)
CSW contacted patient to follow up on Emmi call regarding patient losing interest in things.  Patient stated that he is doing ok overall, he is just wanting the recovery to happen quicker then it is.  Patient states that he is used to being very active, but currently since he is recovering he has not been able to.  Patient stated the home health PT is going well, and he is doing the exercises on his own as well as when the therapist works with him.  Patient expressed that he is 3 weeks post surgery and he feels much better then week one.  Patient states he is going to church on Sunday, and he has good support from his fellow church goers and neighbors who have been checking in on him.  Patient states he is hopeful he will be able to back to work soon, even if he has to do part time or light duty.  Patient stated he was grateful for CSW following up with him to see how he is doing, CSW to sign off.  Ervin KnackEric R. Juanita Devincent, MSW, [employer]CSWA [phone]

## 2017-10-07 ENCOUNTER — Emergency Department: Payer: BLUE CROSS/BLUE SHIELD

## 2017-10-07 ENCOUNTER — Other Ambulatory Visit: Payer: Self-pay

## 2017-10-07 ENCOUNTER — Encounter: Payer: Self-pay | Admitting: Emergency Medicine

## 2017-10-07 ENCOUNTER — Emergency Department
Admission: EM | Admit: 2017-10-07 | Discharge: 2017-10-07 | Disposition: A | Discharge status: Home / Self Care | Payer: BLUE CROSS/BLUE SHIELD | Attending: Emergency Medicine | Admitting: Emergency Medicine

## 2017-10-07 DIAGNOSIS — Z79899 Other long term (current) drug therapy: Secondary | ICD-10-CM | POA: Insufficient documentation

## 2017-10-07 DIAGNOSIS — Y9355 Activity, bike riding: Secondary | ICD-10-CM | POA: Insufficient documentation

## 2017-10-07 DIAGNOSIS — S4992XA Unspecified injury of left shoulder and upper arm, initial encounter: Secondary | ICD-10-CM | POA: Diagnosis present

## 2017-10-07 DIAGNOSIS — S43102A Unspecified dislocation of left acromioclavicular joint, initial encounter: Secondary | ICD-10-CM

## 2017-10-07 DIAGNOSIS — Y929 Unspecified place or not applicable: Secondary | ICD-10-CM | POA: Diagnosis not present

## 2017-10-07 DIAGNOSIS — Z96652 Presence of left artificial knee joint: Secondary | ICD-10-CM | POA: Insufficient documentation

## 2017-10-07 DIAGNOSIS — Y998 Other external cause status: Secondary | ICD-10-CM | POA: Insufficient documentation

## 2017-10-07 MED ORDER — HYDROCODONE-ACETAMINOPHEN 5-325 MG PO TABS: 1.0000 | ORAL_TABLET | Freq: Four times a day (QID) | ORAL | 0 refills | Status: AC | PRN
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
## 2017-10-07 NOTE — Discharge Instructions (Addendum)
Wear the sling. Take your arm out of the sling 3 or 4 times a day and do the shoulder rotations with the shoulder hanging as I described to you. If you have severe pain you can use the Vicodin one 4 times a day otherwise take either your Naprosyn one or 2 pills twice a dayor Motrin 3 of the over-the-counter pills 3 times a day. I would use Naprosyn or Motrin with food. You can also use Tylenol for the pain. Do not take Tylenol and the Vicodin together as the Vicodin also has Tylenol in it and you can get too much Tylenol. Please call the orthopedic surgeon today and arrange follow-up in a week or so. Dr. Allena KatzPatel is on-call for Dr. Ernest PineHooten today.

## 2017-10-07 NOTE — ED Notes (Signed)
Decreased ROM to left shoulder, painful when moving.

## 2018-12-12 IMAGING — DX DG KNEE 1-2V PORT*L*
2 series · 2 of 2 positions shown · non-contrast
Comparison: None.

CLINICAL DATA: Postop left knee replacement.

EXAM:
PORTABLE LEFT KNEE - 1-2 VIEW

[knee ap]
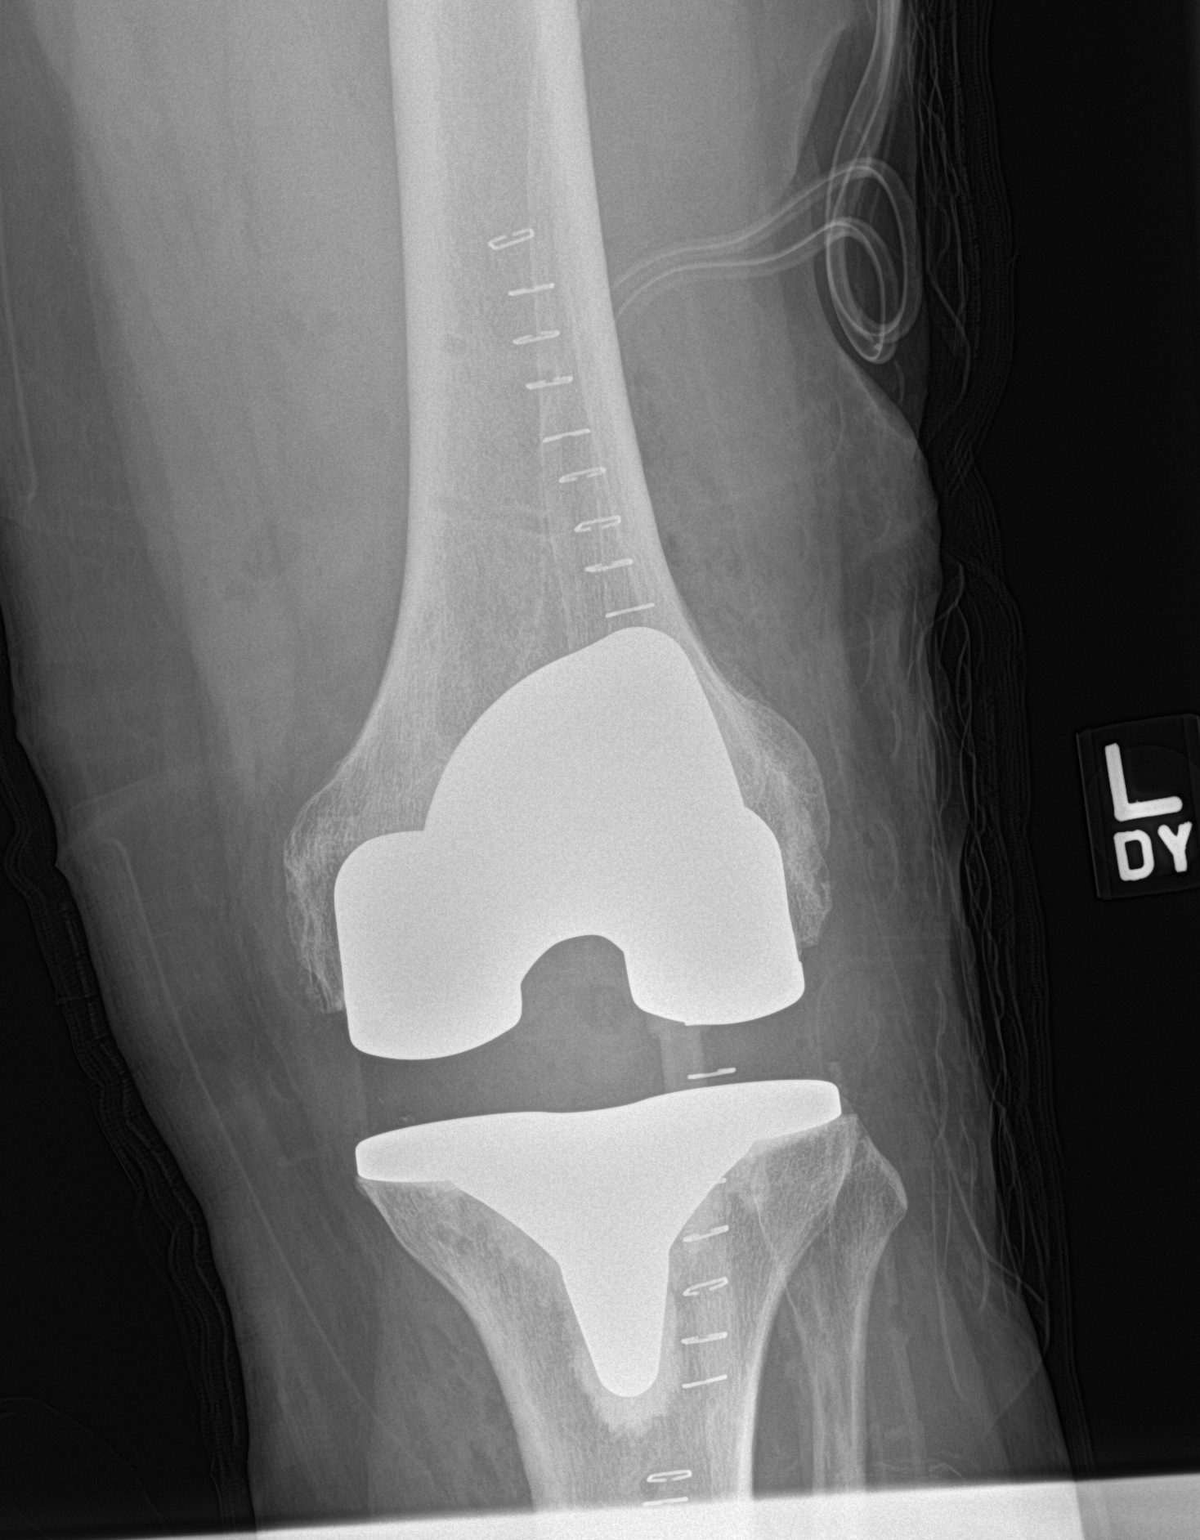

[knee lat]
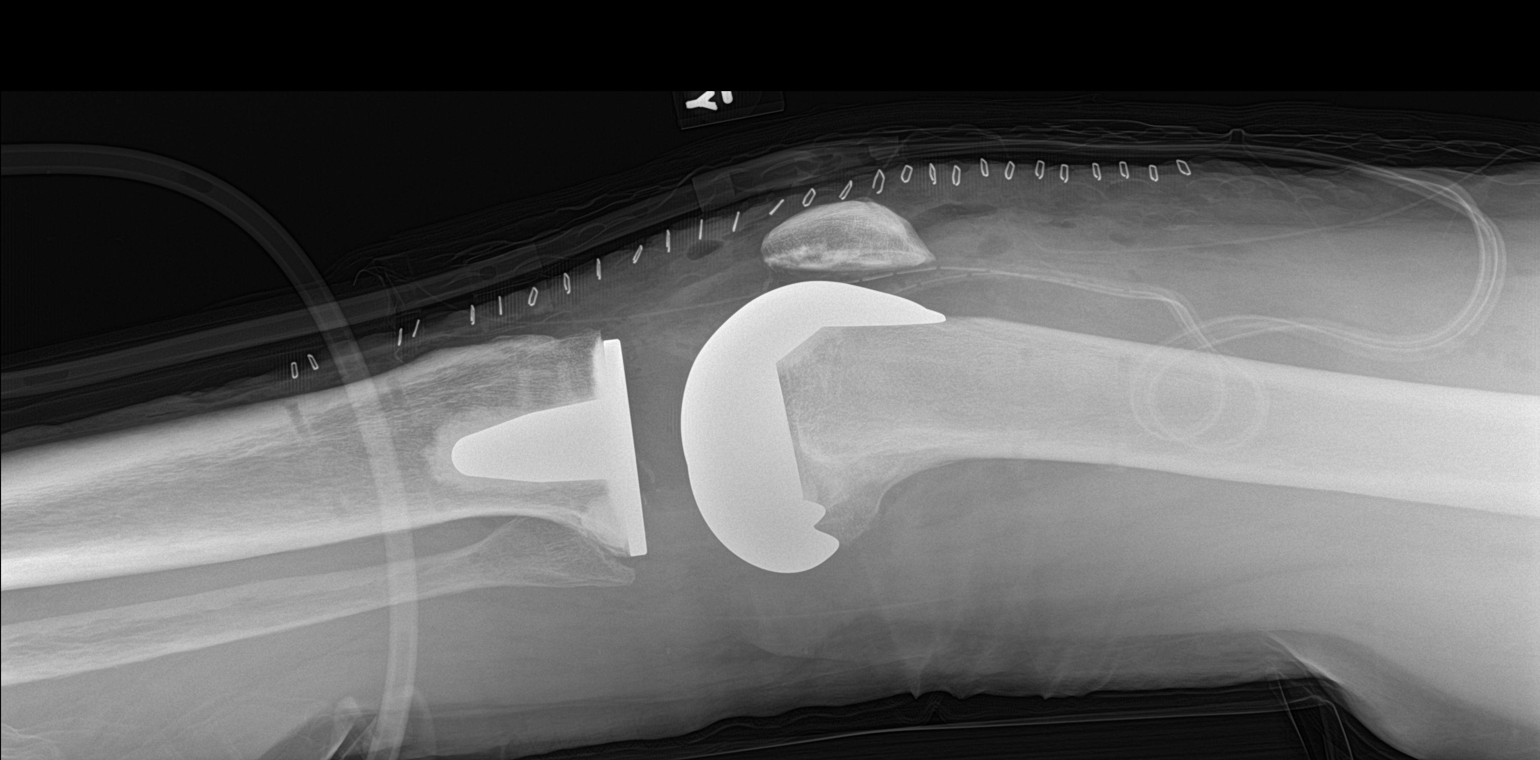

[2 of 2 positions shown; findings below may reference images not displayed]

FINDINGS: Left knee prosthetic components appear well seated and well aligned.
There is no acute fracture or evidence of an operative complication.
IMPRESSION: Well-positioned left knee arthroplasty

## 2019-08-10 ENCOUNTER — Ambulatory Visit: Payer: 59 | Attending: Internal Medicine

## 2019-08-10 DIAGNOSIS — Z23 Encounter for immunization: Secondary | ICD-10-CM

## 2019-08-10 NOTE — Progress Notes (Signed)
   Covid-19 Vaccination Clinic  Name:  Edward Compton    MRN: [id number] DOB: 31-Mar-1961  08/10/2019  [name] was observed post Covid-19 immunization for 15 minutes without incident. He was provided with Vaccine Information Sheet and instruction to access the V-Safe system.   [name] was instructed to call 911 with any severe reactions post vaccine: Marland Kitchen Difficulty breathing  . Swelling of face and throat  . A fast heartbeat  . A bad rash all over body  . Dizziness and weakness   Immunizations Administered    Name Date Dose VIS Date Route   Pfizer COVID-19 Vaccine 08/10/2019  8:18 AM 0.3 mL 04/13/2019 Intramuscular   Manufacturer: [employer], [employer]   Lot: [id number]   NDC: [address]-6

## 2019-09-04 ENCOUNTER — Ambulatory Visit: Payer: 59 | Attending: Internal Medicine

## 2019-09-04 DIAGNOSIS — Z23 Encounter for immunization: Secondary | ICD-10-CM

## 2019-09-04 NOTE — Progress Notes (Signed)
   Covid-19 Vaccination Clinic  Name:  Edward Compton    MRN: [id number] DOB: 1960-05-26  09/04/2019  Mr. [name] was observed post Covid-19 immunization for 15 minutes without incident. He was provided with Vaccine Information Sheet and instruction to access the V-Safe system.   Mr. [name] was instructed to call 911 with any severe reactions post vaccine: Marland Kitchen Difficulty breathing  . Swelling of face and throat  . A fast heartbeat  . A bad rash all over body  . Dizziness and weakness   Immunizations Administered    Name Date Dose VIS Date Route   Pfizer COVID-19 Vaccine 09/04/2019  9:47 AM 0.3 mL 06/27/2018 Intramuscular   Manufacturer: [employer], [employer]   Lot: [id number]   NDC: [address]-5
